# Patient Record
Sex: Male | Born: 1950 | Race: Black or African American | Hispanic: No | Marital: Single | Smoking: Never smoker
Health system: Southern US, Community
[De-identification: ages and names within clinical notes are randomized; demographics above are authoritative.]

## PROBLEM LIST (undated history)

## (undated) DIAGNOSIS — I10 Essential (primary) hypertension: Secondary | ICD-10-CM

## (undated) DIAGNOSIS — K5792 Diverticulitis of intestine, part unspecified, without perforation or abscess without bleeding: Secondary | ICD-10-CM

## (undated) DIAGNOSIS — E7800 Pure hypercholesterolemia, unspecified: Secondary | ICD-10-CM

---

## 2009-03-27 ENCOUNTER — Encounter: Admission: RE | Admit: 2009-03-27 | Discharge: 2009-03-27 | Payer: Self-pay | Admitting: Gastroenterology

## 2009-05-14 ENCOUNTER — Encounter (INDEPENDENT_AMBULATORY_CARE_PROVIDER_SITE_OTHER): Payer: Self-pay | Admitting: General Surgery

## 2009-05-14 ENCOUNTER — Inpatient Hospital Stay (HOSPITAL_COMMUNITY): Admission: RE | Admit: 2009-05-14 | Discharge: 2009-05-19 | Payer: Self-pay | Admitting: General Surgery

## 2010-09-24 LAB — CBC
HCT: 29.1 % — ABNORMAL LOW (ref 39.0–52.0)
HCT: 40.7 % (ref 39.0–52.0)
Hemoglobin: 13.9 g/dL (ref 13.0–17.0)
MCHC: 33.1 g/dL (ref 30.0–36.0)
MCHC: 34.3 g/dL (ref 30.0–36.0)
MCHC: 34.3 g/dL (ref 30.0–36.0)
MCV: 97.2 fL (ref 78.0–100.0)
MCV: 97.9 fL (ref 78.0–100.0)
Platelets: 158 10*3/uL (ref 150–400)
Platelets: 209 10*3/uL (ref 150–400)
RBC: 2.99 MIL/uL — ABNORMAL LOW (ref 4.22–5.81)
RBC: 3.05 MIL/uL — ABNORMAL LOW (ref 4.22–5.81)
RDW: 12.8 % (ref 11.5–15.5)
RDW: 12.8 % (ref 11.5–15.5)
RDW: 12.9 % (ref 11.5–15.5)
WBC: 6.6 10*3/uL (ref 4.0–10.5)

## 2010-09-24 LAB — BASIC METABOLIC PANEL
BUN: 7 mg/dL (ref 6–23)
BUN: 9 mg/dL (ref 6–23)
CO2: 28 mEq/L (ref 19–32)
CO2: 30 mEq/L (ref 19–32)
GFR calc Af Amer: >60 mL/min (ref >60)
GFR calc non Af Amer: >60 mL/min (ref >60)
Glucose, Bld: 107 mg/dL — ABNORMAL HIGH (ref 70–99)
Glucose, Bld: 135 mg/dL — ABNORMAL HIGH (ref 70–99)
Potassium: 4.8 mEq/L (ref 3.5–5.1)

## 2010-09-24 LAB — PROTIME-INR
INR: 1.05 (ref 0.00–1.49)
Prothrombin Time: 13.6 seconds (ref 11.6–15.2)

## 2010-09-24 LAB — URINALYSIS, ROUTINE W REFLEX MICROSCOPIC
Glucose, UA: NEGATIVE mg/dL
Nitrite: NEGATIVE
Specific Gravity, Urine: 1.021 (ref 1.005–1.030)
Urobilinogen, UA: 0.2 mg/dL (ref 0.0–1.0)
pH: 6 (ref 5.0–8.0)

## 2010-09-24 LAB — COMPREHENSIVE METABOLIC PANEL
ALT: 19 U/L (ref 0–53)
BUN: 15 mg/dL (ref 6–23)
CO2: 33 mEq/L — ABNORMAL HIGH (ref 19–32)
Calcium: 9.6 mg/dL (ref 8.4–10.5)
Creatinine, Ser: 1.14 mg/dL (ref 0.4–1.5)
GFR calc Af Amer: >60 mL/min (ref >60)
Potassium: 4.7 mEq/L (ref 3.5–5.1)
Sodium: 139 mEq/L (ref 135–145)
Total Bilirubin: 0.7 mg/dL (ref 0.3–1.2)

## 2010-09-24 LAB — DIFFERENTIAL
Basophils Relative: 1 % (ref 0–1)
Eosinophils Absolute: 0 10*3/uL (ref 0.0–0.7)
Monocytes Relative: 9 % (ref 3–12)
Neutro Abs: 3 10*3/uL (ref 1.7–7.7)

## 2010-09-24 LAB — TYPE AND SCREEN

## 2010-09-24 LAB — PHOSPHORUS: Phosphorus: 3.6 mg/dL (ref 2.3–4.6)

## 2012-02-27 ENCOUNTER — Ambulatory Visit: Payer: Self-pay | Admitting: Family Medicine

## 2012-02-27 VITALS — BP 152/84 | HR 69 | Temp 97.8°F | Resp 20 | Ht 70.0 in | Wt 167.0 lb

## 2012-02-27 DIAGNOSIS — I10 Essential (primary) hypertension: Secondary | ICD-10-CM

## 2012-02-27 DIAGNOSIS — E7800 Pure hypercholesterolemia, unspecified: Secondary | ICD-10-CM | POA: Insufficient documentation

## 2012-02-27 DIAGNOSIS — D649 Anemia, unspecified: Secondary | ICD-10-CM

## 2012-02-27 LAB — POCT CBC
Granulocyte percent: 52.3 %G (ref 37–80)
HCT, POC: 49.3 % (ref 43.5–53.7)
MCHC: 30.8 g/dL — AB (ref 31.8–35.4)
MCV: 102.3 fL — AB (ref 80–97)
MID (cbc): 0.3 (ref 0–0.9)
POC MID %: 7.9 %M (ref 0–12)

## 2012-02-27 LAB — BASIC METABOLIC PANEL
BUN: 23 mg/dL (ref 6–23)
Calcium: 9.7 mg/dL (ref 8.4–10.5)
Creat: 1.19 mg/dL (ref 0.50–1.35)
Sodium: 137 mEq/L (ref 135–145)

## 2012-02-27 LAB — LIPID PANEL
Cholesterol: 311 mg/dL — ABNORMAL HIGH (ref 0–200)
LDL Cholesterol: 225 mg/dL — ABNORMAL HIGH (ref 0–99)
Total CHOL/HDL Ratio: 4.1 Ratio

## 2012-02-27 MED ORDER — LISINOPRIL-HYDROCHLOROTHIAZIDE 10-12.5 MG PO TABS: 1.0000 | ORAL_TABLET | Freq: Every day | ORAL | Status: AC

## 2012-02-27 NOTE — Progress Notes (Signed)
[facility] [address], [place] [address] [phone]  Date:  02/27/2012   Name:  Jesse Oneal   DOB:  1950-11-18   MRN:  [id number]  PCP:  No primary provider on file.    Chief Complaint: Hypertension and Hyperlipidemia   History of Present Illness:  Jesse Oneal is a 61 y.o. very pleasant male patient who presents with the following:  Last here about 13 months ago.  He needs a refill of his medications.  He is still taking his HTN medication but he is about to run out.  He did take it this morning.  He also stopped taking his cholesterol medication several months ago.  He had been taking 1/2 of a 40 mg atorvastatin daily.  I am not sure why he stopped this- ?someone told him he did not have to take it anymore.  Or he may have just run out and not refilled He is fasting this morning for labs.    Patient Active Problem List  Diagnosis  . Hypertension  . High cholesterol  . Normocytic anemia    No past medical history on file.  No past surgical history on file.  History  Substance Use Topics  . Smoking status: Never Smoker   . Smokeless tobacco: Not on file  . Alcohol Use: Not on file    No family history on file.  No Known Allergies  Medication list has been reviewed and updated.  Current Outpatient Prescriptions on File Prior to Visit  Medication Sig Dispense Refill  . hydrochlorothiazide (MICROZIDE) 12.5 MG capsule Take 12.5 mg by mouth daily.        Review of Systems:  As per HPI- otherwise negative.   Physical Examination: Filed Vitals:   02/27/12 0817  BP: 152/84  Pulse: 69  Temp: 97.8 F (36.6 C)  Resp: 20   Filed Vitals:   02/27/12 0817  Height: 5\' 10"  (1.778 m)  Weight: 167 lb (75.751 kg)   Body mass index is 23.96 kg/(m^2). Ideal Body Weight: Weight in (lb) to have BMI = 25: 173.9   GEN: WDWN, NAD, Non-toxic, A & O x 3 HEENT: Atraumatic, Normocephalic. Neck supple. No masses, No LAD.  PEERL, EOMI,  oropharynx  Ears and Nose: No external deformity. CV: RRR, No M/G/R. No JVD. No thrill. No extra heart sounds. PULM: CTA B, no wheezes, crackles, rhonchi. No retractions. No resp. distress. No accessory muscle use. ABD: S, NT, ND EXTR: No c/c/e NEURO Normal gait.  PSYCH: Normally interactive. Conversant. Not depressed or anxious appearing.  Calm demeanor.   Results for orders placed in visit on 02/27/12  POCT CBC      Component Value Range   WBC 3.6 (*) 4.6 - 10.2 K/uL   Lymph, poc 1.4  0.6 - 3.4   POC LYMPH PERCENT 39.8  10 - 50 %L   MID (cbc) 0.3  0 - 0.9   POC MID % 7.9  0 - 12 %M   POC Granulocyte 1.9 (*) 2 - 6.9   Granulocyte percent 52.3  37 - 80 %G   RBC 4.82  4.69 - 6.13 M/uL   Hemoglobin 15.2  14.1 - 18.1 g/dL   HCT, POC 57.8  46.9 - 53.7 %   MCV 102.3 (*) 80 - 97 fL   MCH, POC 31.5 (*) 27 - 31.2 pg   MCHC 30.8 (*) 31.8 - 35.4 g/dL   RDW, POC 13.1  Platelet Count, POC 220  142 - 424 K/uL   MPV 11.1  0 - 99.8 fL     Assessment and Plan: 1. Hypertension  Basic metabolic panel, lisinopril-hydrochlorothiazide (PRINZIDE,ZESTORETIC) 10-12.5 MG per tablet  2. High cholesterol  Lipid panel  3. Normocytic anemia  POCT CBC   HTN is uncontrolled. Stop HCTZ, start lisinopril/ HCTZ.  Otherwise await labs for further follow- up.  Suspect he will need to restart his CHL medication but we can await his FLP results.   Abbe Amsterdam, MD

## 2012-02-29 ENCOUNTER — Telehealth: Payer: Self-pay | Admitting: Family Medicine

## 2012-02-29 ENCOUNTER — Encounter: Payer: Self-pay | Admitting: Family Medicine

## 2012-02-29 MED ORDER — ATORVASTATIN CALCIUM 20 MG PO TABS: 20.0000 mg | ORAL_TABLET | Freq: Every day | ORAL | Status: AC

## 2012-02-29 NOTE — Telephone Encounter (Signed)
See lab letter, patient notified results.

## 2012-02-29 NOTE — Telephone Encounter (Signed)
PT RETURNED CALL  [phone]

## 2012-02-29 NOTE — Addendum Note (Signed)
Addended by: Abbe Amsterdam C on: 02/29/2012 01:59 PM   Modules accepted: Orders

## 2012-02-29 NOTE — Telephone Encounter (Signed)
LMOM to call back about labs

## 2012-02-29 NOTE — Telephone Encounter (Signed)
LMOM of labs and to pick up medicine at his pharmacy for his cholesterol was high Dr Patsy Lager asked me to call patient to give him results

## 2012-11-20 ENCOUNTER — Ambulatory Visit: Payer: BC Managed Care – PPO

## 2012-11-20 ENCOUNTER — Encounter (HOSPITAL_COMMUNITY): Payer: Self-pay | Admitting: Emergency Medicine

## 2012-11-20 ENCOUNTER — Emergency Department (INDEPENDENT_AMBULATORY_CARE_PROVIDER_SITE_OTHER)
Admission: EM | Admit: 2012-11-20 | Discharge: 2012-11-20 | Disposition: A | Discharge status: Home / Self Care | Payer: BC Managed Care – PPO | Source: Home / Self Care

## 2012-11-20 DIAGNOSIS — J309 Allergic rhinitis, unspecified: Secondary | ICD-10-CM

## 2012-11-20 DIAGNOSIS — J302 Other seasonal allergic rhinitis: Secondary | ICD-10-CM

## 2012-11-20 HISTORY — DX: Diverticulitis of intestine, part unspecified, without perforation or abscess without bleeding: K57.92

## 2012-11-20 HISTORY — DX: Essential (primary) hypertension: I10

## 2012-11-20 HISTORY — DX: Pure hypercholesterolemia, unspecified: E78.00

## 2012-11-20 MED ORDER — HYDROCOD POLST-CHLORPHEN POLST 10-8 MG/5ML PO LQCR: 5.0000 mL | Freq: Two times a day (BID) | ORAL | Status: DC | PRN

## 2012-11-20 MED ORDER — FEXOFENADINE HCL 180 MG PO TABS: 180.0000 mg | ORAL_TABLET | Freq: Every day | ORAL | Status: DC

## 2012-11-20 MED ORDER — AZITHROMYCIN 250 MG PO TABS: ORAL_TABLET | ORAL | Status: DC

## 2012-11-20 MED ORDER — FLUTICASONE PROPIONATE 50 MCG/ACT NA SUSP: 2.0000 | Freq: Two times a day (BID) | NASAL | Status: AC

## 2012-11-20 NOTE — ED Provider Notes (Signed)
History     CSN: [id number]  Arrival date & time 11/20/12  1129   None     Chief Complaint  Patient presents with  . URI    dry cough. throat soreness from cough. runny nose    (Consider location/radiation/quality/duration/timing/severity/associated sxs/prior treatment) Patient is a 62 y.o. male presenting with URI. The history is provided by the patient.  URI Presenting symptoms: congestion, cough and rhinorrhea   Presenting symptoms: no fever   Severity:  Mild Onset quality:  Gradual Duration:  1 week Progression:  Unchanged Chronicity:  New Relieved by:  OTC medications   Past Medical History  Diagnosis Date  . Hypertension   . Elevated cholesterol   . Diverticulitis     History reviewed. No pertinent past surgical history.  History reviewed. No pertinent family history.  History  Substance Use Topics  . Smoking status: Never Smoker   . Smokeless tobacco: Not on file  . Alcohol Use: No      Review of Systems  Constitutional: Negative.  Negative for fever.  HENT: Positive for congestion, rhinorrhea and postnasal drip.   Respiratory: Positive for cough.   Cardiovascular: Negative.   Gastrointestinal: Negative.     Allergies  Review of patient's allergies indicates no known allergies.  Home Medications   Current Outpatient Rx  Name  Route  Sig  Dispense  Refill  . atorvastatin (LIPITOR) 20 MG tablet   Oral   Take 1 tablet (20 mg total) by mouth daily.   90 tablet   3   . lisinopril-hydrochlorothiazide (PRINZIDE,ZESTORETIC) 10-12.5 MG per tablet   Oral   Take 1 tablet by mouth daily.   90 tablet   3   . azithromycin (ZITHROMAX Z-PAK) 250 MG tablet      Take as directed on pack   6 each   0   . chlorpheniramine-HYDROcodone (TUSSIONEX PENNKINETIC ER) 10-8 MG/5ML LQCR   Oral   Take 5 mLs by mouth every 12 (twelve) hours as needed.   115 mL   0   . fexofenadine (ALLEGRA) 180 MG tablet   Oral   Take 1 tablet (180 mg total) by mouth  daily.   30 tablet   1   . fluticasone (FLONASE) 50 MCG/ACT nasal spray   Nasal   Place 2 sprays into the nose 2 (two) times daily.   1 g   2     BP 131/82  Pulse 97  Temp(Src) 97.7 F (36.5 C) (Oral)  Resp 18  SpO2 98%  Physical Exam  Nursing note and vitals reviewed. Constitutional: He is oriented to person, place, and time. He appears well-developed and well-nourished.  HENT:  Head: Normocephalic.  Right Ear: External ear normal.  Left Ear: External ear normal.  Nose: Mucosal edema and rhinorrhea present.  Mouth/Throat: Oropharynx is clear and moist.  Neck: Normal range of motion. Neck supple.  Pulmonary/Chest: Effort normal and breath sounds normal.  Lymphadenopathy:    He has no cervical adenopathy.  Neurological: He is alert and oriented to person, place, and time.  Skin: Skin is warm and dry.    ED Course  Procedures (including critical care time)  Labs Reviewed - No data to display No results found.   1. Seasonal allergic rhinitis       MDM          Linna Hoff, MD 11/20/12 1251

## 2012-11-20 NOTE — ED Notes (Signed)
Pt c/o a persistent dry cough that is worse at night. Throat soreness from coughing.  Denies fever, n/v/d. Pt has used otc meds with mild relief in symptoms.  Symptoms present since Friday.

## 2013-01-11 ENCOUNTER — Ambulatory Visit: Payer: BC Managed Care – PPO

## 2013-01-11 ENCOUNTER — Ambulatory Visit: Payer: BC Managed Care – PPO | Admitting: Emergency Medicine

## 2013-01-11 VITALS — BP 124/84 | HR 92 | Temp 97.7°F | Resp 18 | Ht 70.0 in | Wt 172.0 lb

## 2013-01-11 DIAGNOSIS — R05 Cough: Secondary | ICD-10-CM

## 2013-01-11 DIAGNOSIS — I10 Essential (primary) hypertension: Secondary | ICD-10-CM

## 2013-01-11 MED ORDER — HYDROCHLOROTHIAZIDE 25 MG PO TABS: 25.0000 mg | ORAL_TABLET | Freq: Every day | ORAL | Status: AC

## 2013-01-11 NOTE — Progress Notes (Signed)
[facility] [address] [place] [address] [phone]  Date:  01/11/2013   Name:  Jesse Oneal   DOB:  [birth year]   MRN:  [id number]  PCP:  No PCP Per Patient    Chief Complaint: Cough   History of Present Illness:  Jesse Oneal is a 62 y.o. very pleasant male patient who presents with the following:  Has a cough since May when he was seen and put on a cough syrup.  Says he was again seen at [facility].  He continues to cough.  No wheezing, shortness of breath.  No sputum production.  No hemoptysis.  No improvement with over the counter medications or other home remedies. Denies other complaint or health concern today.   Patient Active Problem List   Diagnosis Date Noted  . Hypertension 02/27/2012  . High cholesterol 02/27/2012  . Normocytic anemia 02/27/2012    Past Medical History  Diagnosis Date  . Hypertension   . Elevated cholesterol   . Diverticulitis     History reviewed. No pertinent past surgical history.  History  Substance Use Topics  . Smoking status: Never Smoker   . Smokeless tobacco: Not on file  . Alcohol Use: No    History reviewed. No pertinent family history.  No Known Allergies  Medication list has been reviewed and updated.  Current Outpatient Prescriptions on File Prior to Visit  Medication Sig Dispense Refill  . atorvastatin (LIPITOR) 20 MG tablet Take 1 tablet (20 mg total) by mouth daily.  90 tablet  3  . lisinopril-hydrochlorothiazide (PRINZIDE,ZESTORETIC) 10-12.5 MG per tablet Take 1 tablet by mouth daily.  90 tablet  3  . azithromycin (ZITHROMAX Z-PAK) 250 MG tablet Take as directed on pack  6 each  0  . chlorpheniramine-HYDROcodone (TUSSIONEX PENNKINETIC ER) 10-8 MG/5ML LQCR Take 5 mLs by mouth every 12 (twelve) hours as needed.  115 mL  0  . fexofenadine (ALLEGRA) 180 MG tablet Take 1 tablet (180 mg total) by mouth daily.  30 tablet  1  . fluticasone (FLONASE) 50 MCG/ACT nasal spray Place 2 sprays  into the nose 2 (two) times daily.  1 g  2   No current facility-administered medications on file prior to visit.    Review of Systems:  As per HPI, otherwise negative.    Physical Examination: Filed Vitals:   01/11/13 1324  BP: 124/84  Pulse: 92  Temp: 97.7 F (36.5 C)  Resp: 18   Filed Vitals:   01/11/13 1324  Height: 5\' 10"  (1.778 m)  Weight: 172 lb (78.019 kg)   Body mass index is 24.68 kg/(m^2). Ideal Body Weight: Weight in (lb) to have BMI = 25: 173.9  GEN: WDWN, NAD, Non-toxic, A & O x 3 HEENT: Atraumatic, Normocephalic. Neck supple. No masses, No LAD. Ears and Nose: No external deformity. CV: RRR, No M/G/R. No JVD. No thrill. No extra heart sounds. PULM: CTA B, no wheezes, crackles, rhonchi. No retractions. No resp. distress. No accessory muscle use. ABD: S, NT, ND, +BS. No rebound. No HSM. EXTR: No c/c/e NEURO Normal gait.  PSYCH: Normally interactive. Conversant. Not depressed or anxious appearing.  Calm demeanor.    Assessment and Plan: Hypertension Cough ACE inhibitor cough.  Stop lisinopril CXR Start HCTZ   Signed,  Phillips Odor, MD   [facility] reading (PRIMARY) by  Dr. Dareen Piano.  negative.

## 2013-01-14 ENCOUNTER — Telehealth: Payer: Self-pay

## 2013-01-14 NOTE — Telephone Encounter (Signed)
Pt did stop Lisinopril but still has the cough and wants to know if we can rx him something.

## 2013-01-14 NOTE — Telephone Encounter (Signed)
error 

## 2013-01-14 NOTE — Telephone Encounter (Signed)
Can we rx him a

## 2013-01-14 NOTE — Telephone Encounter (Signed)
Pt is calling to see if he could get a stronger prescription for his cough. He has high blood pressure so he isn't sure what to take because the over the counter is not doing much Call back number is [phone]

## 2013-01-17 MED ORDER — HYDROCOD POLST-CHLORPHEN POLST 10-8 MG/5ML PO LQCR: 5.0000 mL | Freq: Two times a day (BID) | ORAL | Status: DC | PRN

## 2013-01-17 NOTE — Addendum Note (Signed)
Addended by: Carmelina Dane on: 01/17/2013 08:03 AM   Modules accepted: Orders

## 2013-01-17 NOTE — Telephone Encounter (Signed)
A prescription was sent.  He needs to pick it up

## 2013-07-04 ENCOUNTER — Emergency Department (HOSPITAL_COMMUNITY)
Admission: EM | Admit: 2013-07-04 | Discharge: 2013-07-05 | Disposition: A | Discharge status: Home / Self Care | Payer: BC Managed Care – PPO | Attending: Emergency Medicine | Admitting: Emergency Medicine

## 2013-07-04 ENCOUNTER — Encounter (HOSPITAL_COMMUNITY): Payer: Self-pay | Admitting: Emergency Medicine

## 2013-07-04 ENCOUNTER — Emergency Department (HOSPITAL_COMMUNITY)
Admission: EM | Admit: 2013-07-04 | Discharge: 2013-07-04 | Disposition: A | Discharge status: Nursing Home (Includes ICF) | Payer: BC Managed Care – PPO | Source: Home / Self Care | Attending: Emergency Medicine | Admitting: Emergency Medicine

## 2013-07-04 ENCOUNTER — Telehealth: Payer: Self-pay

## 2013-07-04 DIAGNOSIS — Z79899 Other long term (current) drug therapy: Secondary | ICD-10-CM | POA: Insufficient documentation

## 2013-07-04 DIAGNOSIS — Z8719 Personal history of other diseases of the digestive system: Secondary | ICD-10-CM | POA: Insufficient documentation

## 2013-07-04 DIAGNOSIS — R000 Tachycardia, unspecified: Secondary | ICD-10-CM | POA: Insufficient documentation

## 2013-07-04 DIAGNOSIS — I10 Essential (primary) hypertension: Secondary | ICD-10-CM

## 2013-07-04 DIAGNOSIS — E7800 Pure hypercholesterolemia, unspecified: Secondary | ICD-10-CM | POA: Insufficient documentation

## 2013-07-04 DIAGNOSIS — N289 Disorder of kidney and ureter, unspecified: Secondary | ICD-10-CM | POA: Insufficient documentation

## 2013-07-04 DIAGNOSIS — N189 Chronic kidney disease, unspecified: Secondary | ICD-10-CM

## 2013-07-04 DIAGNOSIS — IMO0002 Reserved for concepts with insufficient information to code with codable children: Secondary | ICD-10-CM | POA: Insufficient documentation

## 2013-07-04 DIAGNOSIS — I498 Other specified cardiac arrhythmias: Secondary | ICD-10-CM

## 2013-07-04 LAB — CBC WITH DIFFERENTIAL/PLATELET
Basophils Absolute: 0 10*3/uL (ref 0.0–0.1)
Basophils Relative: 0 % (ref 0–1)
Eosinophils Absolute: 0 10*3/uL (ref 0.0–0.7)
Eosinophils Relative: 1 % (ref 0–5)
HCT: 41.6 % (ref 39.0–52.0)
Hemoglobin: 15.3 g/dL (ref 13.0–17.0)
Lymphocytes Relative: 30 % (ref 12–46)
Lymphs Abs: 1.4 10*3/uL (ref 0.7–4.0)
MCH: 34.2 pg — ABNORMAL HIGH (ref 26.0–34.0)
MCHC: 36.8 g/dL — ABNORMAL HIGH (ref 30.0–36.0)
MCV: 93.1 fL (ref 78.0–100.0)
Monocytes Absolute: 0.4 10*3/uL (ref 0.1–1.0)
Monocytes Relative: 8 % (ref 3–12)
Neutro Abs: 2.8 10*3/uL (ref 1.7–7.7)
Neutrophils Relative %: 61 % (ref 43–77)
Platelets: 176 10*3/uL (ref 150–400)
RBC: 4.47 MIL/uL (ref 4.22–5.81)
RDW: 13.5 % (ref 11.5–15.5)
WBC: 4.5 10*3/uL (ref 4.0–10.5)

## 2013-07-04 LAB — BASIC METABOLIC PANEL
BUN: 20 mg/dL (ref 6–23)
CO2: 26 mEq/L (ref 19–32)
Calcium: 9.3 mg/dL (ref 8.4–10.5)
Chloride: 99 mEq/L (ref 96–112)
Creatinine, Ser: 1.45 mg/dL — ABNORMAL HIGH (ref 0.50–1.35)
GFR calc Af Amer: 57 mL/min — ABNORMAL LOW (ref >90)
GFR calc non Af Amer: 49 mL/min — ABNORMAL LOW (ref >90)
Glucose, Bld: 111 mg/dL — ABNORMAL HIGH (ref 70–99)
Potassium: 4.5 mEq/L (ref 3.7–5.3)
Sodium: 137 mEq/L (ref 137–147)

## 2013-07-04 LAB — POCT I-STAT, CHEM 8
BUN: 23 mg/dL (ref 6–23)
CREATININE: 1.7 mg/dL — AB (ref 0.50–1.35)
Calcium, Ion: 1.24 mmol/L (ref 1.13–1.30)
Chloride: 99 mEq/L (ref 96–112)
Glucose, Bld: 115 mg/dL — ABNORMAL HIGH (ref 70–99)
HCT: 51 % (ref 39.0–52.0)
Hemoglobin: 17.3 g/dL — ABNORMAL HIGH (ref 13.0–17.0)
Potassium: 4.5 mEq/L (ref 3.7–5.3)
Sodium: 139 mEq/L (ref 137–147)
TCO2: 27 mmol/L (ref 0–100)

## 2013-07-04 MED ORDER — SODIUM CHLORIDE 0.9 % IV BOLUS (SEPSIS)
1000.0000 mL | Freq: Once | INTRAVENOUS | Status: AC
  Administered 2013-07-04: 1000 mL via INTRAVENOUS

## 2013-07-04 NOTE — ED Notes (Signed)
Pt comes from Urgent care for tachycardia and elevated creatinine.  [facility] and his HR was noted to be elevated.  [facility] due to his creatinine.  Pt states he feels fine and has no complaints at this time.

## 2013-07-04 NOTE — ED Provider Notes (Signed)
Chief Complaint:   Chief Complaint  Patient presents with  . Palpitations    History of Present Illness:   Jesse Oneal is a 63 year old gentleman with high blood pressure and hypercholesterolemia who was sent here by occupational medicine because of rapid heartbeat. He presented there for a physical examination for [facility]. He received several immunizations. They noted his heart rate to be 111 and sent him over here. He is unaware of any sensation of rapid or irregular heartbeat. He denies any chest pain, tightness, pressure, or shortness of breath. He has not felt dizzy, lightheaded, or had any syncope episodes. He does note some "funny feeling" in his left arm. He has not had a fever or chills, he denies any URI symptoms, denies any ingestion of decongestants or any other medication that might speed up his heart rate, denies any history of anemia or thyroid disease. He's had no leg pain or swelling. Reviewing his previous EKGs and previous visits shows that he's never had tachycardia before. The patient states he was seen by his private physician a couple weeks ago and his examination at that time was normal.  Review of Systems:  Other than noted above, the patient denies any of the following symptoms. Systemic:  No fever, chills, or fatigue. Pulmonary:  No cough, wheezing, shortness of breath. Cardiac:  No chest pain, tightness, pressure, dizziness, presyncope, syncope, PND, orthopnea, or edema. Ext:  No leg pain or swelling. Neuro:  No weakness, paresthesias, or difficulty with speech or gait. Psych:  No anxiety or depression. Endo:  No weight loss, tremor, sweats, or heat intolerance.   PMFSH:  Past medical history, family history, social history, meds, and allergies were reviewed and updated as needed. No history of cardiac disease.  No history of excessive alcohol intake. Current meds include Lipitor, hydrochlorothiazide, and lisinopril.  Physical Exam:   Vital signs:   BP 167/110  Pulse 112  Temp(Src) 97.5 F (36.4 C) (Oral)  Resp 20  SpO2 100% Filed Vitals:   07/04/13 1841 07/04/13 1858 Supine  07/04/13 1859 Sitting  07/04/13 1900 Standing   BP: 160/105 167/110    Pulse: 113 114 111 112  Temp: 97.5 F (36.4 C)     TempSrc: Oral     Resp: 20     SpO2: 100%      Gen:  Alert, oriented, in no distress, skin warm and dry. Eye:  PERRL, lids and conjunctivas normal.  No stare or lid lag. ENT:  Mucous membranes moist, pharynx clear. Neck:  Supple, no adenopathy or tenderness.  No JVD.  Thyroid not enlarged. Lungs:  Clear to auscultation, no wheezes, rales or rhonchi.  No respiratory distress. Heart:  Regular but rapid rhythm, no extrasystoles.  No gallops, murmers, clicks or rubs. Abdomen:  Soft, nontender, no organomegaly or mass.  Bowel sounds normal.  No pulsatile abdominal mass or bruit. Ext:  No edema. Pulses full and equal. Skin:  Warm and dry.  No rash.  Labs:   Results for orders placed during the hospital encounter of 07/04/13  POCT I-STAT, CHEM 8      Result Value Range   Sodium 139  137 - 147 mEq/L   Potassium 4.5  3.7 - 5.3 mEq/L   Chloride 99  96 - 112 mEq/L   BUN 23  6 - 23 mg/dL   Creatinine, Ser 4.09 (*) 0.50 - 1.35 mg/dL   Glucose, Bld 811 (*) 70 - 99 mg/dL   Calcium, Ion 9.14  1.13 - 1.30 mmol/L   TCO2 27  0 - 100 mmol/L   Hemoglobin 17.3 (*) 13.0 - 17.0 g/dL   HCT 16.151.0  09.639.0 - 04.552.0 %     EKG:   Date: 07/04/2013  Rate: 113  Rhythm: sinus tachycardia  QRS Axis: normal  Intervals: normal  ST/T Wave abnormalities: normal  Conduction Disutrbances:none  Narrative Interpretation: Sinus tachycardia, otherwise normal EKG, no change from a previous EKG.  Old EKG Reviewed: unchanged  Assessment:  The primary encounter diagnosis was Sinus tachycardia. A diagnosis of Chronic kidney disease was also pertinent to this visit.  The patient has unexplained rapid heartbeat. I do not think that the immunizations that he got today  was explained this. Differential diagnosis includes anxiety, hyperthyroidism, pulmonary embolism, or congestive heart failure.  Plan:   The patient was transferred to the ED via shuttle in stable condition.  Medical Decision Making     63 year old male has unexplained sinus tachycardia.  He [facility] at [facility] health [facility]  He was found to have a heart rate of 111 and was sent here.  Upon arrival his heart rate was between 111 abd 118, his EKG showed sinus tach.  No other changes.  Reviewing his previous visits, there was no evidence of tachycardia in the past.  He was not orthostatic, no fever, no medications that could be responsible, no history of anemia or thyroid disease.  No chest pain or shortness of breath.  He did have some left arm pain, but had gotten several immunizations at employee health.  His iStat-8 was WNL except for a creatinine of 1.7.  I cannot explain his tachycardia and am not comfortable sending him home without further evaluation.         Reuben Likesavid C Payslee Bateson, MD 07/04/13 [phone]

## 2013-07-04 NOTE — Discharge Instructions (Signed)
Arterial Hypertension °Arterial hypertension (high blood pressure) is a condition of elevated pressure in your blood vessels. Hypertension over a long period of time is a risk factor for strokes, heart attacks, and heart failure. It is also the leading cause of kidney (renal) failure.  °CAUSES  °· In Adults -- Over 90% of all hypertension has no known cause. This is called essential or primary hypertension. In the other 10% of people with hypertension, the increase in blood pressure is caused by another disorder. This is called secondary hypertension. Important causes of secondary hypertension are: °· Heavy alcohol use. °· Obstructive sleep apnea. °· Hyperaldosterosim (Conn's syndrome). °· Steroid use. °· Chronic kidney failure. °· Hyperparathyroidism. °· Medications. °· Renal artery stenosis. °· Pheochromocytoma. °· Cushing's disease. °· Coarctation of the aorta. °· Scleroderma renal crisis. °· Licorice (in excessive amounts). °· Drugs (cocaine, methamphetamine). °Your caregiver can explain any items above that apply to you. °· In Children -- Secondary hypertension is more common and should always be considered. °· Pregnancy -- Few women of childbearing age have high blood pressure. However, up to 10% of them develop hypertension of pregnancy. Generally, this will not harm the woman. It may be a sign of 3 complications of pregnancy: preeclampsia, HELLP syndrome, and eclampsia. Follow up and control with medication is necessary. °SYMPTOMS  °· This condition normally does not produce any noticeable symptoms. It is usually found during a routine exam. °· Malignant hypertension is a late problem of high blood pressure. It may have the following symptoms: °· Headaches. °· Blurred vision. °· End-organ damage (this means your kidneys, heart, lungs, and other organs are being damaged). °· Stressful situations can increase the blood pressure. If a person with normal blood pressure has their blood pressure go up while being  seen by their caregiver, this is often termed "white coat hypertension." Its importance is not known. It may be related with eventually developing hypertension or complications of hypertension. °· Hypertension is often confused with mental tension, stress, and anxiety. °DIAGNOSIS  °The diagnosis is made by 3 separate blood pressure measurements. They are taken at least 1 week apart from each other. If there is organ damage from hypertension, the diagnosis may be made without repeat measurements. °Hypertension is usually identified by having blood pressure readings: °· Above 140/90 mmHg measured in both arms, at 3 separate times, over a couple weeks. °· Over 130/80 mmHg should be considered a risk factor and may require treatment in patients with diabetes. °Blood pressure readings over 120/80 mmHg are called "pre-hypertension" even in non-diabetic patients. °To get a true blood pressure measurement, use the following guidelines. Be aware of the factors that can alter blood pressure readings. °· Take measurements at least 1 hour after caffeine. °· Take measurements 30 minutes after smoking and without any stress. This is another reason to quit smoking  it raises your blood pressure. °· Use a proper cuff size. Ask your caregiver if you are not sure about your cuff size. °· Most home blood pressure cuffs are automatic. They will measure systolic and diastolic pressures. The systolic pressure is the pressure reading at the start of sounds. Diastolic pressure is the pressure at which the sounds disappear. If you are elderly, measure pressures in multiple postures. Try sitting, lying or standing. °· Sit at rest for a minimum of 5 minutes before taking measurements. °· You should not be on any medications like decongestants. These are found in many cold medications. °· Record your blood pressure readings and review   them with your caregiver. °If you have hypertension: °· Your caregiver may do tests to be sure you do not have  secondary hypertension (see "causes" above). °· Your caregiver may also look for signs of metabolic syndrome. This is also called Syndrome X or Insulin Resistance Syndrome. You may have this syndrome if you have type 2 diabetes, abdominal obesity, and abnormal blood lipids in addition to hypertension. °· Your caregiver will take your medical and family history and perform a physical exam. °· Diagnostic tests may include blood tests (for glucose, cholesterol, potassium, and kidney function), a urinalysis, or an EKG. Other tests may also be necessary depending on your condition. °PREVENTION  °There are important lifestyle issues that you can adopt to reduce your chance of developing hypertension: °· Maintain a normal weight. °· Limit the amount of salt (sodium) in your diet. °· Exercise often. °· Limit alcohol intake. °· Get enough potassium in your diet. Discuss specific advice with your caregiver. °· Follow a DASH diet (dietary approaches to stop hypertension). This diet is rich in fruits, vegetables, and low-fat dairy products, and avoids certain fats. °PROGNOSIS  °Essential hypertension cannot be cured. Lifestyle changes and medical treatment can lower blood pressure and reduce complications. The prognosis of secondary hypertension depends on the underlying cause. Many people whose hypertension is controlled with medicine or lifestyle changes can live a normal, healthy life.  °RISKS AND COMPLICATIONS  °While high blood pressure alone is not an illness, it often requires treatment due to its short- and long-term effects on many organs. Hypertension increases your risk for: °· CVAs or strokes (cerebrovascular accident). °· Heart failure due to chronically high blood pressure (hypertensive cardiomyopathy). °· Heart attack (myocardial infarction). °· Damage to the retina (hypertensive retinopathy). °· Kidney failure (hypertensive nephropathy). °Your caregiver can explain list items above that apply to you. Treatment  of hypertension can significantly reduce the risk of complications. °TREATMENT  °· For overweight patients, weight loss and regular exercise are recommended. Physical fitness lowers blood pressure. °· Mild hypertension is usually treated with diet and exercise. A diet rich in fruits and vegetables, fat-free dairy products, and foods low in fat and salt (sodium) can help lower blood pressure. Decreasing salt intake decreases blood pressure in a 1/3 of people. °· Stop smoking if you are a smoker. °The steps above are highly effective in reducing blood pressure. While these actions are easy to suggest, they are difficult to achieve. Most patients with moderate or severe hypertension end up requiring medications to bring their blood pressure down to a normal level. There are several classes of medications for treatment. Blood pressure pills (antihypertensives) will lower blood pressure by their different actions. Lowering the blood pressure by 10 mmHg may decrease the risk of complications by as much as 25%. °The goal of treatment is effective blood pressure control. This will reduce your risk for complications. Your caregiver will help you determine the best treatment for you according to your lifestyle. What is excellent treatment for one person, may not be for you. °HOME CARE INSTRUCTIONS  °· Do not smoke. °· Follow the lifestyle changes outlined in the "Prevention" section. °· If you are on medications, follow the directions carefully. Blood pressure medications must be taken as prescribed. Skipping doses reduces their benefit. It also puts you at risk for problems. °· Follow up with your caregiver, as directed. °· If you are asked to monitor your blood pressure at home, follow the guidelines in the "Diagnosis" section above. °SEEK MEDICAL CARE   IF:  °· You think you are having medication side effects. °· You have recurrent headaches or lightheadedness. °· You have swelling in your ankles. °· You have trouble with  your vision. °SEEK IMMEDIATE MEDICAL CARE IF:  °· You have sudden onset of chest pain or pressure, difficulty breathing, or other symptoms of a heart attack. °· You have a severe headache. °· You have symptoms of a stroke (such as sudden weakness, difficulty speaking, difficulty walking). °MAKE SURE YOU:  °· Understand these instructions. °· Will watch your condition. °· Will get help right away if you are not doing well or get worse. °Document Released: 06/08/2005 Document Revised: 08/31/2011 Document Reviewed: 01/06/2007 °ExitCare® Patient Information ©2014 [employer]. ° ° ° °Emergency Department Resource Guide °1) Find a Doctor and Pay Out of Pocket °Although you won't have to find out who is covered by your insurance plan, it is a good idea to ask around and get recommendations. You will then need to call the office and see if the doctor you have chosen will accept you as a new patient and what types of options they offer for patients who are self-pay. Some doctors offer discounts or will set up payment plans for their patients who do not have insurance, but you will need to ask so you aren't surprised when you get to your appointment. ° °2) Contact Your Local Health Department °Not all health departments have doctors that can see patients for sick visits, but many do, so it is worth a call to see if yours does. If you don't know where your local health department is, you can check in your phone book. The CDC also has a tool to help you locate your state's health department, and many state websites also have listings of all of their local health departments. ° °3) Find a Walk-in Clinic °If your illness is not likely to be very severe or complicated, you may want to try a walk in clinic. These are popping up all over the country in pharmacies, drugstores, and shopping centers. They're usually staffed by nurse practitioners or physician assistants that have been trained to treat common illnesses and complaints.  They're usually fairly quick and inexpensive. However, if you have serious medical issues or chronic medical problems, these are probably not your best option. ° °No Primary Care Doctor: °- Call [facility] at  832-8000 - they can help you locate a primary care doctor that  accepts your insurance, provides certain services, etc. °- Physician Referral Service- 1-800-533-3463 ° °Chronic Pain Problems: °Organization         Address  Phone   Notes  °[facility]  (336) 297-2271 Patients need to be referred by their primary care doctor.  ° °Medication Assistance: °Organization         Address  Phone   Notes  °[place] [facility] [address] °[place], [place] [address] (336) 641-8030 --Must be a resident of [place] °-- Must have NO insurance coverage whatsoever (no Medicaid/ Medicare, etc.) °-- The pt. MUST have a primary care doctor that directs their care regularly and follows them in the community °  °[facility]  (866) 331-1348   °[employer]  (888) 892-1162   ° °Agencies that provide inexpensive medical care: °Organization         Address  Phone   Notes  °[facility]  (336) 832-8035   °[facility]    (336) 832-7272   °  [facility] [address] °[place], [place] [address] (336) 832-4777   °[facility] [address], °[place] (336) 271-4999   °[facility]    (336) 373-0678   °[facility]    (336) 272-1050   °[facility] ° [address], [place] Phone:  (336) 832-4444, Fax:  (336) 832-4440 Hours of Operation:  9 am - 6 pm, M-F.  Also accepts Medicaid/Medicare and self-pay.  °[facility] ° [address], [place] Phone: (336) [phone], Fax: (336) [phone]. Hours of Operation:  8:30 am - 5:30 pm, M-F.  Also accepts Medicaid and self-pay.  °[facility] [place] [address], [place]  Phone: (336) 878-6027   °[facility] [address], [place], [place] (336)723-1848, Ext. 123 Mondays & Thursdays: 7-9 AM.  First 15 patients are seen on a first come, first serve basis. °  ° °Medicaid-accepting [place] Providers: ° °Organization         Address  Phone   Notes  °[facility] [address], [place] (336) 641-2100 Also accepts self-pay patients.  °[facility] [address], [place] ° (336) 856-9996   °[facility] [address], [place] (336) 288-8857   °[facility] [address] [place] [address], [place] (336) 299-7000   °Veita Bland [address], [place]  ° (336) [phone] Only accepts [place] Access Medicaid patients after they have their name applied to their card.  ° °Self-Pay (no insurance) in [place]: ° °Organization         Address  Phone   Notes  °Sickle Cell Patients, [facility] [address], [place] (336) 832-1970   °[facility] [address], [place] (336) 832-4400   °[facility] ° [address], [place] (336) 992-4800   °[facility]/Dr. Osei-Bonsu ° [address] [place] [address], [place] or [address], [place] (336) [phone] Phone number for both [place] and [place] locations is the same.  °[facility] [address], [place] (336) 299-0000   °[facility] [address] [place] [address], [place] or [address] (336) 852-7530 °(336) 878-2260   °[facility] [address], [place] (336) 350-1642, phone; (336) 294-5005, fax Sees patients 1st and 3rd Saturday of every month.  Must not qualify for public or private insurance (i.e. Medicaid, Medicare, [place] Health Choice, Veterans' Benefits) • Household income should be no more than 200% of the poverty level •The clinic cannot  treat you if you are pregnant or think you are pregnant • Sexually transmitted diseases are not treated at the clinic.  ° ° °Dental Care: °Organization         Address  Phone  Notes  °[facility] [address], [place] (336) 641-6152 Accepts children up to age 21 who are enrolled in Medicaid or [place] Health Choice; pregnant women with a Medicaid card; and children who have applied for Medicaid or  Health Choice, but were declined, whose parents can pay a reduced fee at time of service.  °[facility] [place]  [address], [place] (336) 641-7733 Accepts children up to age 21 who   are enrolled in Medicaid or [place] Health Choice; pregnant women with a Medicaid card; and children who have applied for Medicaid or [place] Health Choice, but were declined, whose parents can pay a reduced fee at time of service.  °[facility] ° [address], [place] (336) 641-4533 Patients are seen by appointment only. Walk-ins are not accepted. [facility] will see patients 18 years of age and older. °Monday - Tuesday (8am-5pm) °Most Wednesdays (8:30-5pm) °$30 per visit, cash only  °[facility] ° [address], [place] (336) 641-4533 Patients are seen by appointment only. Walk-ins are not accepted. [facility] will see patients 18 years of age and older. °One Wednesday Evening (Monthly: Volunteer Based).  $30 per visit, cash only  °[facility]  (919) 537-3737 for adults; Children under age 4, call Graduate Pediatric Dentistry at ([phone]) [phone]. Children aged 4-14, please call ([phone]) [phone] to request a pediatric application. ° Dental services are provided in all areas of dental care including fillings, crowns and bridges, complete and partial dentures, implants, gum treatment, root canals, and extractions. Preventive care is also provided.  Treatment is provided to both adults and children. °Patients are selected via a lottery and there is often a waiting list. °  °[facility] [address], °[place] ° (336) 763-8833 [email or web address] °  °[facility] [address], [place], [place] (336)723-1848, Ext. 123 Second and Fourth Thursday of each month, opens at 6:30 AM; Clinic ends at 9 AM.  Patients are seen on a first-come first-served basis, and a limited number are seen during each clinic.  ° °[facility] ° [address], [place], [place] (336) 723-7904   Eligibility Requirements °You must have lived in [place], [place], or [place] counties for at least the last three months. °  You cannot be eligible for state or federal sponsored healthcare insurance, including Veterans Administration, Medicaid, or Medicare. °  You generally cannot be eligible for healthcare insurance through your employer.  °  How to apply: °Eligibility screenings are held every Tuesday and Wednesday afternoon from 1:00 pm until 4:00 pm. You do not need an appointment for the interview!  °[facility] [address], [place], [place] 336-631-2330   °[facility]  336-342-8273   °[facility]  336-703-3100   °[facility]  336-570-6415   ° °Behavioral Health Resources in the Community: °Intensive Outpatient Programs °Organization         Address  Phone  Notes  °[facility] [address], [place], [place] 336-878-6098   °[facility] [address], [place], [place] 336-832-9800   °[facility] [address], [place], [place] ° 336-882-2125   °[facility] [address],  °, [place] 1-800-853-5163 or 336-641-4981   °Substance Abuse Resources °Organization         Address  Phone  Notes  °[facility]  336-882-2125   °[facility]   336-784-9470   °[facility]  336-285-9073   °[facility]  336-845-3988   °Residential & Outpatient Substance Abuse Program  1-800-659-3381   °Psychological Services °Organization         Address  Phone  Notes  °[facility]  336- 832-9600   °[facility]  336- 378-7881   °[facility] [address],   [place] 1-800-853-5163 or 336-641-4981   ° °Mobile Crisis Teams °Organization         Address  Phone  Notes  °[facility], Mobile Crisis Care Unit  1-877-626-1772   °Assertive °[facility] ° [address] [place], [place] 336-834-9664   °Sharon DeEsch [address] °[place] 336-554-5454   ° °Self-Help/Support Groups °Organization         Address  Phone             Notes  °[facility] - variety of support groups  336- 373-1402 Call for more information  °Narcotics Anonymous (NA), [facility] [address], °[place]  2 meetings at this location  ° °Residential Treatment Programs °Organization         Address  Phone  Notes  °[facility] [address],    °[place]  1-866-801-8205   °[facility] ° [address], [place], [place] 704-293-8524   °[facility] [address], [place] 336-845-3988 Admissions: 8am-3pm M-F  °[facility] [address]    °[place], [place] 336-841-1104   °[facility] [address], [place] [employer], [place] 336-379-7146   °[facility] [address]  °[place], [place] 336-285-9073   °[facility] - Intensive Outpatient [address] [place] [address], [place], [place] 336-852-3033   °[facility] [address]  °[place], [place] 1-877-615-2722 or 336-784-9470   °[facility] [address] [place], [place] 336-227-7417 Accepts Medicaid  °[facility] [address]  °[place] 1-800-659-3381 Substance  Abuse/Addiction Treatment  ° °[place] Behavioral Health Resources °Organization         Address  Phone  Notes  °[facility]  (888) 581-9988   °Julie Brannon, PhD [address] [place], [place]   (336) 349-5553 or (336) 951-0000   °[facility]   [address] °[place], [place] (336) 349-4454   °[facility] [address], [place], [place] (336) 342-8316 Insurance/Medicaid/sponsorship through [facility]  °[facility] [address]                                    [place], [place] (336) 342-8316 Therapy/tele-psych/case  °[facility] [address]  ° [place], [place] (336) 349-2233    °Dr. Arfeen  (336) 349-4544   °[facility] [place]  [employer] [place] [facility] 1) [address], [place] °2) [address], [place] °3)  [address], [place] (336) 349-3220 °(336) 342-7768 ° °(336) 342-8140   °[place] [facility] (336) 342-1394 or (336) 342-3537 (After Hours)    ° ° ° °

## 2013-07-04 NOTE — Telephone Encounter (Signed)
No record of TDap administration in this office. Was not able to find a paper chart either.

## 2013-07-04 NOTE — ED Notes (Signed)
Pt in c/o tachycardia that was discovered at a pre-employment evaluation, denies symptoms associated with this, denies pain or shortness of breath, recent cough and congestion over the last week, denies fever at home, no distress at this time

## 2013-07-04 NOTE — ED Notes (Signed)
Dr. Lorenz CoasterKeller is in w/the pt.... 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 He is alert w/no signs of acute distress.

## 2013-07-04 NOTE — Telephone Encounter (Signed)
Patient needs to know the date of his last TDap.  [phone]

## 2013-07-04 NOTE — Discharge Instructions (Signed)
We have determined that your problem requires further evaluation in the emergency department.  We will take care of your transport there.  Once at the emergency department, you will be evaluated by a provider and they will order whatever treatment or tests they deem necessary.  We cannot guarantee that they will do any specific test or do any specific treatment.  ° °

## 2013-07-05 LAB — TSH: TSH: 0.762 u[IU]/mL (ref 0.350–4.500)

## 2013-07-10 NOTE — ED Provider Notes (Signed)
CSN: [id number]     Arrival date & time 07/04/13  1949 History   First MD Initiated Contact with Patient 07/04/13 2112     Chief Complaint  Patient presents with  . Tachycardia   (Consider location/radiation/quality/duration/timing/severity/associated sxs/prior Treatment) HPI  63 year old male referred from urgent care for further evaluation of tachycardia and renal impairment. Patient initially seen at [facility] for physical noted to have an elevated heart rate. This is again noted at urgent care. Labs drawn there showed a creatinine of 1.7. Patient currently has no complaints. He denies any pain. No shortness of breath. No dizziness or lightheadedness. No unusual leg pain or swelling. No recent surgical procedures, long trips or prolonged immobilization. No history of DVT. Patient has been told that he has high blood pressure previously. He is unsure of any prior history of renal impairment.  Past Medical History  Diagnosis Date  . Hypertension   . Elevated cholesterol   . Diverticulitis    History reviewed. No pertinent past surgical history. No family history on file. History  Substance Use Topics  . Smoking status: Never Smoker   . Smokeless tobacco: Not on file  . Alcohol Use: No    Review of Systems  All systems reviewed and negative, other than as noted in HPI.   Allergies  Review of patient's allergies indicates no known allergies.  Home Medications   Current Outpatient Rx  Name  Route  Sig  Dispense  Refill  . EXPIRED: atorvastatin (LIPITOR) 20 MG tablet   Oral   Take 1 tablet (20 mg total) by mouth daily.   90 tablet   3   . fluticasone (FLONASE) 50 MCG/ACT nasal spray   Nasal   Place 2 sprays into the nose 2 (two) times daily.   1 g   2   . hydrochlorothiazide (HYDRODIURIL) 25 MG tablet   Oral   Take 1 tablet (25 mg total) by mouth daily.   90 tablet   3   . EXPIRED: lisinopril-hydrochlorothiazide (PRINZIDE,ZESTORETIC) 10-12.5 MG per  tablet   Oral   Take 1 tablet by mouth daily.   90 tablet   3    BP 137/87  Pulse 100  Temp(Src) 98.8 F (37.1 C) (Oral)  Resp 13  SpO2 97% Physical Exam  Nursing note and vitals reviewed. Constitutional: He appears well-developed and well-nourished. No distress.  HENT:  Head: Normocephalic and atraumatic.  Eyes: Conjunctivae are normal. Right eye exhibits no discharge. Left eye exhibits no discharge.  Neck: Neck supple.  Cardiovascular: Regular rhythm and normal heart sounds.  Exam reveals no gallop and no friction rub.   No murmur heard. Tachycardic  Pulmonary/Chest: Effort normal and breath sounds normal. No respiratory distress.  Abdominal: Soft. He exhibits no distension. There is no tenderness.  Musculoskeletal: He exhibits no edema and no tenderness.  Lower extremities symmetric as compared to each other. No calf tenderness. Negative Homan's. No palpable cords.   Neurological: He is alert.  Skin: Skin is warm and dry.  Psychiatric: He has a normal mood and affect. His behavior is normal. Thought content normal.    ED Course  Procedures (including critical care time) Labs Review Labs Reviewed  BASIC METABOLIC PANEL - Abnormal; Notable for the following:    Glucose, Bld 111 (*)    Creatinine, Ser 1.45 (*)    GFR calc non Af Amer 49 (*)    GFR calc Af Amer 57 (*)    All other components within normal limits  CBC WITH DIFFERENTIAL - Abnormal; Notable for the following:    MCH 34.2 (*)    MCHC 36.8 (*)    All other components within normal limits  TSH   Imaging Review No results found.  EKG Interpretation    Date/Time:  Tuesday July 04 2013 22:24:28 EST Ventricular Rate:  107 PR Interval:  159 QRS Duration: 101 QT Interval:  350 QTC Calculation: 467 R Axis:   -13 Text Interpretation:  Sinus tachycardia Left atrial enlargement ED PHYSICIAN INTERPRETATION AVAILABLE IN [facility] Confirmed by TEST, RECORD ([id number]) on 07/06/2013 11:59:07 AM             MDM   1. Hypertension   2. Tachycardia   3. Renal insufficiency    63 year old with tachycardia, hypertension and renal insufficiency. He is asymptomatic. Known history of hypertension. He reports compliance with his medicines. I do not have an exact explanation for his tachycardia. He did improve somewhat with a liter of IV fluids. He is afebrile. Patient had a heart rate in the 90s on 2 evaluations in June and July of this past year. Will send TSH which may be of some benefit all provider. Renal insufficiency which I suspect is chronic and may be related to his history of hypertension. No acute intervention. Return precautions discussed. Outpatient followup began otherwise.  Raeford RazorStephen Rodneshia Greenhouse, MD 07/10/13 [phone]

## 2013-12-18 IMAGING — CR DG CHEST 2V
2 series · 2 of 2 positions shown · non-contrast
Comparison: 05/10/2009.

CLINICAL DATA: Shortness of breath and cough.

CHEST - 2 VIEW

[PA]
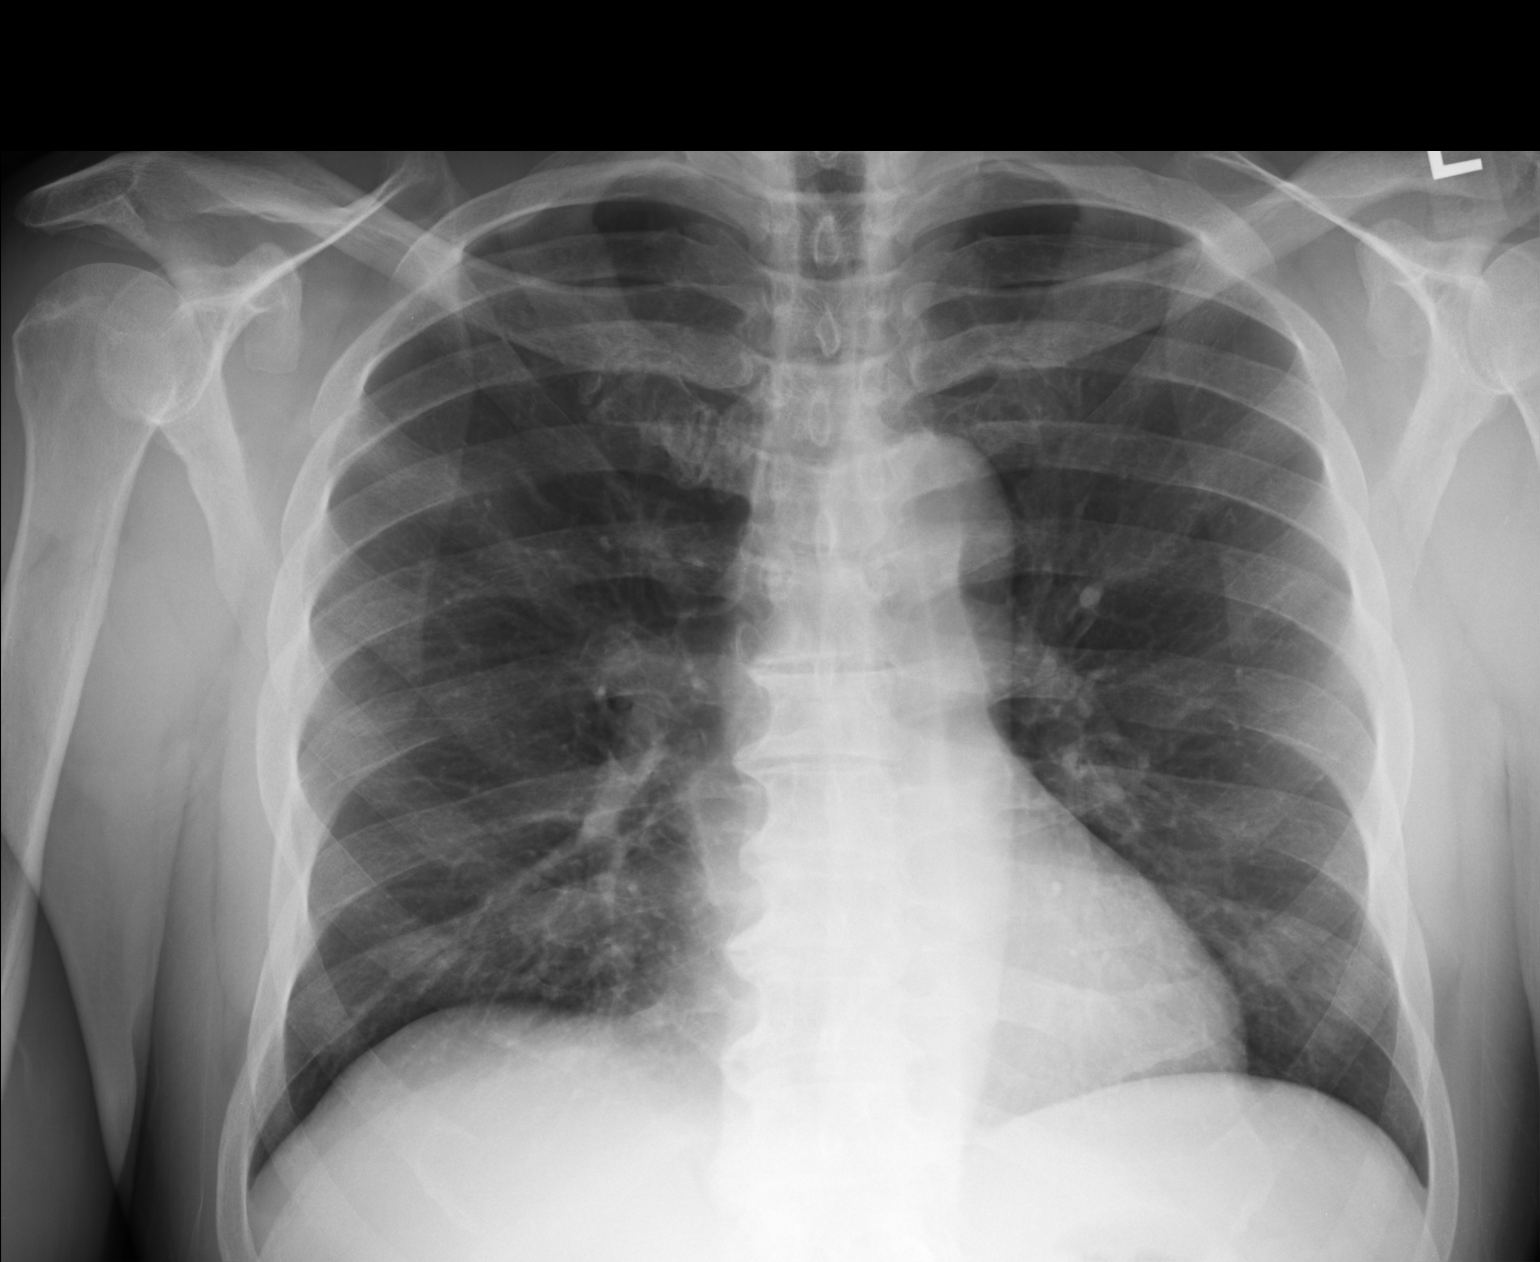

[lateral]
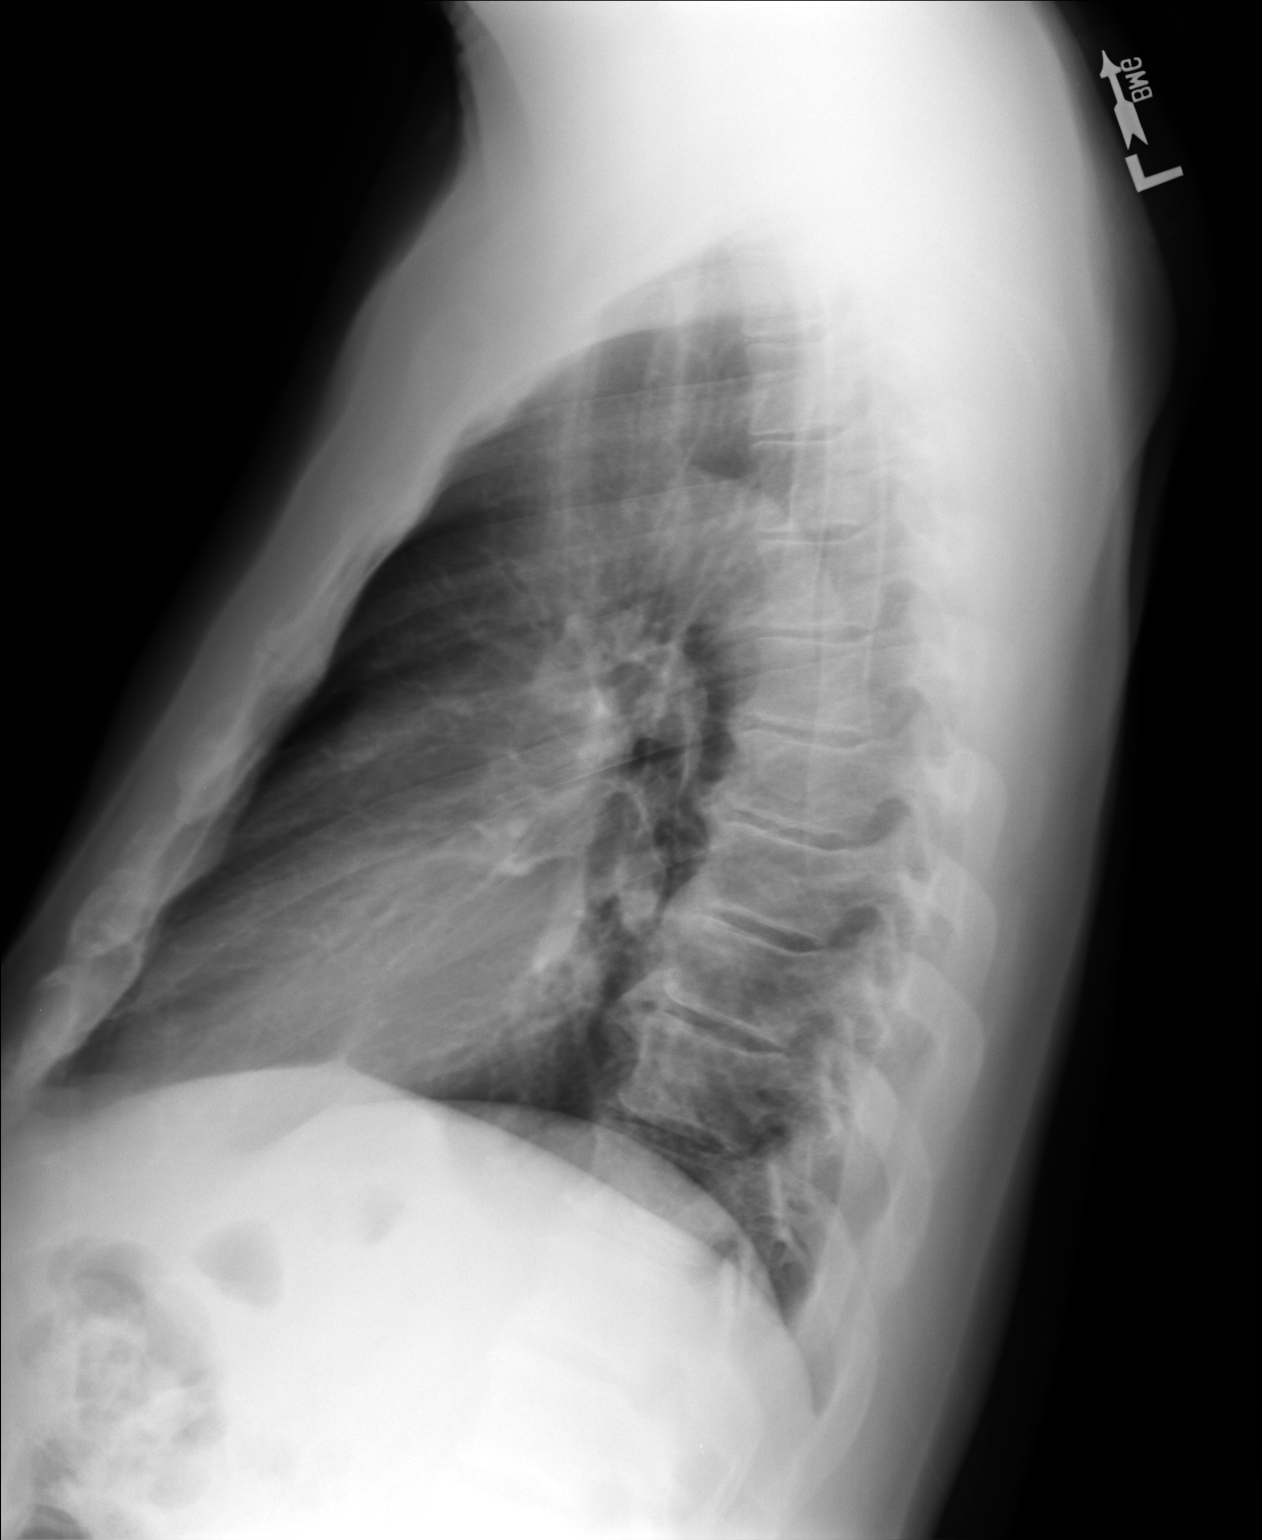

[2 of 2 positions shown; findings below may reference images not displayed]

FINDINGS: The heart size and mediastinal contours are normal. The
lungs are clear. There is no pleural effusion or pneumothorax. No
acute osseous findings are identified.  There are stable
degenerative changes throughout the thoracic spine.
IMPRESSION: Stable examination.  No active cardiopulmonary process.

Clinically significant discrepancy from primary report, if
provided: None

## 2019-05-09 ENCOUNTER — Other Ambulatory Visit: Payer: Self-pay

## 2019-05-09 DIAGNOSIS — Z20822 Contact with and (suspected) exposure to covid-19: Secondary | ICD-10-CM

## 2019-05-10 LAB — NOVEL CORONAVIRUS, NAA: SARS-CoV-2, NAA: NOT DETECTED

## 2019-05-11 ENCOUNTER — Telehealth: Payer: Self-pay | Admitting: *Deleted

## 2019-05-11 NOTE — Telephone Encounter (Signed)
Patient called and was given NEGATIVE COVID results . 

## 2021-08-14 ENCOUNTER — Other Ambulatory Visit: Payer: Self-pay | Admitting: Internal Medicine

## 2021-08-14 LAB — COMPLETE METABOLIC PANEL WITH GFR
AG Ratio: 1.2 (calc) (ref 1.0–2.5)
ALT: 28 U/L (ref 9–46)
AST: 23 U/L (ref 10–35)
Albumin: 4.3 g/dL (ref 3.6–5.1)
Alkaline phosphatase (APISO): 60 U/L (ref 35–144)
BUN/Creatinine Ratio: 18 (calc) (ref 6–22)
BUN: 31 mg/dL — ABNORMAL HIGH (ref 7–25)
CO2: 25 mmol/L (ref 20–32)
Calcium: 9.6 mg/dL (ref 8.6–10.3)
Chloride: 98 mmol/L (ref 98–110)
Creat: 1.75 mg/dL — ABNORMAL HIGH (ref 0.70–1.28)
Globulin: 3.6 g/dL (calc) (ref 1.9–3.7)
Glucose, Bld: 148 mg/dL — ABNORMAL HIGH (ref 65–99)
Potassium: 4.9 mmol/L (ref 3.5–5.3)
Sodium: 132 mmol/L — ABNORMAL LOW (ref 135–146)
Total Bilirubin: 0.8 mg/dL (ref 0.2–1.2)
Total Protein: 7.9 g/dL (ref 6.1–8.1)
eGFR: 41 mL/min/{1.73_m2} — ABNORMAL LOW (ref >=60)

## 2021-08-14 LAB — CBC
HCT: 45.2 % (ref 38.5–50.0)
Hemoglobin: 15.6 g/dL (ref 13.2–17.1)
MCH: 32.2 pg (ref 27.0–33.0)
MCHC: 34.5 g/dL (ref 32.0–36.0)
MCV: 93.2 fL (ref 80.0–100.0)
MPV: 12.5 fL (ref 7.5–12.5)
Platelets: 161 10*3/uL (ref 140–400)
RBC: 4.85 10*6/uL (ref 4.20–5.80)
RDW: 12.9 % (ref 11.0–15.0)
WBC: 3.5 10*3/uL — ABNORMAL LOW (ref 3.8–10.8)

## 2021-08-14 LAB — LIPID PANEL
Cholesterol: 187 mg/dL (ref <200)
HDL: 45 mg/dL (ref >=40)
LDL Cholesterol (Calc): 123 mg/dL (calc) — ABNORMAL HIGH
Non-HDL Cholesterol (Calc): 142 mg/dL (calc) — ABNORMAL HIGH (ref <130)
Total CHOL/HDL Ratio: 4.2 (calc) (ref <5.0)
Triglycerides: 93 mg/dL (ref <150)

## 2021-08-14 LAB — PSA: PSA: 1.3 ng/mL (ref <=4.00)

## 2021-08-14 LAB — TSH: TSH: 0.27 mIU/L — ABNORMAL LOW (ref 0.40–4.50)

## 2021-12-11 ENCOUNTER — Other Ambulatory Visit: Payer: Self-pay | Admitting: Internal Medicine

## 2021-12-12 LAB — BASIC METABOLIC PANEL WITH GFR
BUN/Creatinine Ratio: 18 (calc) (ref 6–22)
BUN: 27 mg/dL — ABNORMAL HIGH (ref 7–25)
CO2: 22 mmol/L (ref 20–32)
Calcium: 9.3 mg/dL (ref 8.6–10.3)
Chloride: 106 mmol/L (ref 98–110)
Creat: 1.53 mg/dL — ABNORMAL HIGH (ref 0.70–1.28)
Glucose, Bld: 66 mg/dL (ref 65–99)
Potassium: 4.3 mmol/L (ref 3.5–5.3)
Sodium: 138 mmol/L (ref 135–146)
eGFR: 48 mL/min/{1.73_m2} — ABNORMAL LOW (ref >=60)

## 2021-12-12 LAB — EXTRA LAV TOP TUBE

## 2022-03-17 ENCOUNTER — Other Ambulatory Visit: Payer: Self-pay | Admitting: Internal Medicine

## 2022-03-18 LAB — RENAL PROFILE WITH ESTIMATED GFR
Albumin: 4.4 g/dL (ref 3.6–5.1)
BUN/Creatinine Ratio: 12 (calc) (ref 6–22)
BUN: 20 mg/dL (ref 7–25)
CO2: 26 mmol/L (ref 20–32)
Calcium: 9.5 mg/dL (ref 8.6–10.3)
Chloride: 102 mmol/L (ref 98–110)
Creat: 1.69 mg/dL — ABNORMAL HIGH (ref 0.70–1.28)
Glucose, Bld: 79 mg/dL (ref 65–99)
Phosphorus: 3.2 mg/dL (ref 2.1–4.3)
Potassium: 4.6 mmol/L (ref 3.5–5.3)
Sodium: 135 mmol/L (ref 135–146)
eGFR: 42 mL/min/{1.73_m2} — ABNORMAL LOW (ref >=60)

## 2022-03-18 LAB — EXTRA LAV TOP TUBE

## 2023-02-02 ENCOUNTER — Other Ambulatory Visit: Payer: Self-pay | Admitting: Internal Medicine

## 2024-02-03 ENCOUNTER — Other Ambulatory Visit: Payer: Self-pay | Admitting: Nephrology

## 2024-02-03 DIAGNOSIS — N1832 Chronic kidney disease, stage 3b: Secondary | ICD-10-CM

## 2024-02-04 ENCOUNTER — Encounter: Payer: Self-pay | Admitting: Nephrology

## 2024-02-07 ENCOUNTER — Ambulatory Visit
Admission: RE | Admit: 2024-02-07 | Discharge: 2024-02-07 | Disposition: A | Discharge status: Home / Self Care | Source: Ambulatory Visit | Attending: Nephrology | Admitting: Nephrology

## 2024-02-07 DIAGNOSIS — N1832 Chronic kidney disease, stage 3b: Secondary | ICD-10-CM
